# Patient Record
Sex: Male | Born: 1990 | Race: Black or African American | Hispanic: No | Marital: Single | Smoking: Current some day smoker
Health system: Southern US, Community
[De-identification: ages and names within clinical notes are randomized; demographics above are authoritative.]

## PROBLEM LIST (undated history)

## (undated) DIAGNOSIS — T300 Burn of unspecified body region, unspecified degree: Secondary | ICD-10-CM

## (undated) DIAGNOSIS — F410 Panic disorder [episodic paroxysmal anxiety] without agoraphobia: Secondary | ICD-10-CM

## (undated) DIAGNOSIS — F419 Anxiety disorder, unspecified: Secondary | ICD-10-CM

## (undated) HISTORY — PX: SKIN GRAFT: SHX250

---
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
## 2011-01-01 ENCOUNTER — Emergency Department (HOSPITAL_COMMUNITY)
Admission: EM | Admit: 2011-01-01 | Discharge: 2011-01-01 | Disposition: A | Discharge status: Home / Self Care | Payer: Self-pay | Attending: Emergency Medicine | Admitting: Emergency Medicine

## 2011-01-01 ENCOUNTER — Emergency Department (HOSPITAL_COMMUNITY): Payer: Self-pay

## 2011-01-01 ENCOUNTER — Encounter: Payer: Self-pay | Admitting: *Deleted

## 2011-01-01 ENCOUNTER — Other Ambulatory Visit: Payer: Self-pay

## 2011-01-01 DIAGNOSIS — R6883 Chills (without fever): Secondary | ICD-10-CM | POA: Insufficient documentation

## 2011-01-01 DIAGNOSIS — F17200 Nicotine dependence, unspecified, uncomplicated: Secondary | ICD-10-CM | POA: Insufficient documentation

## 2011-01-01 DIAGNOSIS — R5381 Other malaise: Secondary | ICD-10-CM | POA: Insufficient documentation

## 2011-01-01 DIAGNOSIS — R109 Unspecified abdominal pain: Secondary | ICD-10-CM | POA: Insufficient documentation

## 2011-01-01 DIAGNOSIS — M549 Dorsalgia, unspecified: Secondary | ICD-10-CM | POA: Insufficient documentation

## 2011-01-01 HISTORY — DX: Burn of unspecified body region, unspecified degree: T30.0

## 2011-01-01 HISTORY — DX: Anxiety disorder, unspecified: F41.9

## 2011-01-01 HISTORY — DX: Panic disorder (episodic paroxysmal anxiety): F41.0

## 2011-01-01 LAB — URINALYSIS, ROUTINE W REFLEX MICROSCOPIC
Bilirubin Urine: NEGATIVE
Hgb urine dipstick: NEGATIVE
Ketones, ur: NEGATIVE mg/dL
Leukocytes, UA: NEGATIVE
Protein, ur: NEGATIVE mg/dL
Urobilinogen, UA: 0.2 mg/dL (ref 0.0–1.0)
pH: 7 (ref 5.0–8.0)
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
## 2011-01-01 NOTE — ED Notes (Signed)
C/o lower back pain, worse on right side and abd pain.  Denies v/d.  C/o some nausea.  Denies injury to lower back or strenuous activity.  States abd pain started this afternoon after walking dogs.  Mother reports pt has not been "acting himself today."  Reports pt is normally active, states pt has been laying around all day today.
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
## 2011-03-02 LAB — RAPID URINE DRUG SCREEN, HOSP PERFORMED
Benzodiazepines: NOT DETECTED
Cocaine: NOT DETECTED
Opiates: NOT DETECTED
Tetrahydrocannabinol: POSITIVE — AB

## 2011-03-02 LAB — BASIC METABOLIC PANEL
BUN: 11
Glucose, Bld: 66 — ABNORMAL LOW
Sodium: 137

## 2011-03-02 LAB — CBC
MCHC: 33.2
MCV: 83.4
Platelets: 192
RDW: 12.6
WBC: 6.5

## 2011-03-02 LAB — URINALYSIS, ROUTINE W REFLEX MICROSCOPIC
Ketones, ur: 40 — AB
Nitrite: NEGATIVE
Specific Gravity, Urine: 1.015

## 2011-03-22 ENCOUNTER — Encounter (HOSPITAL_COMMUNITY): Payer: Self-pay | Admitting: *Deleted

## 2011-03-22 ENCOUNTER — Emergency Department (HOSPITAL_COMMUNITY)
Admission: EM | Admit: 2011-03-22 | Discharge: 2011-03-22 | Disposition: A | Discharge status: Home / Self Care | Payer: Self-pay | Attending: Emergency Medicine | Admitting: Emergency Medicine

## 2011-03-22 DIAGNOSIS — K0889 Other specified disorders of teeth and supporting structures: Secondary | ICD-10-CM

## 2011-03-22 DIAGNOSIS — F17200 Nicotine dependence, unspecified, uncomplicated: Secondary | ICD-10-CM | POA: Insufficient documentation

## 2011-03-22 DIAGNOSIS — K089 Disorder of teeth and supporting structures, unspecified: Secondary | ICD-10-CM | POA: Insufficient documentation

## 2011-03-22 DIAGNOSIS — W19XXXA Unspecified fall, initial encounter: Secondary | ICD-10-CM | POA: Insufficient documentation

## 2011-03-22 DIAGNOSIS — K029 Dental caries, unspecified: Secondary | ICD-10-CM | POA: Insufficient documentation

## 2011-03-22 DIAGNOSIS — K0381 Cracked tooth: Secondary | ICD-10-CM | POA: Insufficient documentation

## 2011-03-22 MED ORDER — HYDROCODONE-ACETAMINOPHEN 5-325 MG PO TABS: ORAL_TABLET | ORAL | Status: AC

## 2011-03-22 MED ORDER — PENICILLIN V POTASSIUM 500 MG PO TABS: 500.0000 mg | ORAL_TABLET | Freq: Four times a day (QID) | ORAL | Status: AC

## 2011-03-22 NOTE — ED Notes (Signed)
Discharge instructions reviewed with pt, questions answered. Pt verbalized understanding.  
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
## 2011-03-30 ENCOUNTER — Emergency Department (HOSPITAL_COMMUNITY): Payer: No Typology Code available for payment source

## 2011-03-30 ENCOUNTER — Emergency Department (HOSPITAL_COMMUNITY)
Admission: EM | Admit: 2011-03-30 | Discharge: 2011-03-30 | Disposition: A | Discharge status: Home / Self Care | Payer: No Typology Code available for payment source | Attending: Emergency Medicine | Admitting: Emergency Medicine

## 2011-03-30 ENCOUNTER — Encounter (HOSPITAL_COMMUNITY): Payer: Self-pay | Admitting: Emergency Medicine

## 2011-03-30 DIAGNOSIS — S20219A Contusion of unspecified front wall of thorax, initial encounter: Secondary | ICD-10-CM | POA: Insufficient documentation

## 2011-03-30 DIAGNOSIS — R079 Chest pain, unspecified: Secondary | ICD-10-CM | POA: Insufficient documentation

## 2011-03-30 DIAGNOSIS — M79609 Pain in unspecified limb: Secondary | ICD-10-CM | POA: Insufficient documentation

## 2011-03-30 DIAGNOSIS — T07XXXA Unspecified multiple injuries, initial encounter: Secondary | ICD-10-CM

## 2011-03-30 MED ORDER — NAPROXEN 500 MG PO TABS: 500.0000 mg | ORAL_TABLET | Freq: Two times a day (BID) | ORAL | Status: AC

## 2011-03-30 MED ORDER — CYCLOBENZAPRINE HCL 10 MG PO TABS: 10.0000 mg | ORAL_TABLET | Freq: Three times a day (TID) | ORAL | Status: AC | PRN
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
## 2011-03-30 NOTE — ED Provider Notes (Signed)
Medical screening examination/treatment/procedure(s) were performed by non-physician practitioner and as supervising physician I was immediately available for consultation/collaboration.  Donnetta Hutching, MD 03/30/11 [phone]
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
## 2011-04-02 NOTE — ED Provider Notes (Signed)
Medical screening examination/treatment/procedure(s) were performed by non-physician practitioner and as supervising physician I was immediately available for consultation/collaboration. Anwita Mencer, MD, FACEP   Kambria Grima L Keandrea Tapley, MD 04/02/11 0219 

## 2011-10-30 ENCOUNTER — Encounter (HOSPITAL_COMMUNITY): Payer: Self-pay | Admitting: Emergency Medicine

## 2011-10-30 ENCOUNTER — Emergency Department (HOSPITAL_COMMUNITY)
Admission: EM | Admit: 2011-10-30 | Discharge: 2011-10-30 | Disposition: A | Discharge status: Home / Self Care | Payer: Self-pay | Attending: Emergency Medicine | Admitting: Emergency Medicine

## 2011-10-30 DIAGNOSIS — K029 Dental caries, unspecified: Secondary | ICD-10-CM | POA: Insufficient documentation

## 2011-10-30 DIAGNOSIS — K0889 Other specified disorders of teeth and supporting structures: Secondary | ICD-10-CM

## 2011-10-30 DIAGNOSIS — F17200 Nicotine dependence, unspecified, uncomplicated: Secondary | ICD-10-CM | POA: Insufficient documentation

## 2011-10-30 MED ORDER — CLINDAMYCIN HCL 300 MG PO CAPS: ORAL_CAPSULE | ORAL | Status: DC

## 2011-10-30 MED ORDER — HYDROCODONE-ACETAMINOPHEN 5-325 MG PO TABS: ORAL_TABLET | ORAL | Status: AC

## 2011-10-30 NOTE — ED Notes (Signed)
Pt presents with c/o lower left tooth pain and abscess. Left lower jaw and facial swelling noted. NAD noted. Pt denies fever.
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
## 2011-10-30 NOTE — ED Notes (Signed)
Pt c/o left sided jaw pain since yesterday.

## 2011-10-30 NOTE — Discharge Instructions (Signed)
Dental Pain A tooth ache may be caused by cavities (tooth decay). Cavities expose the nerve of the tooth to air and hot or cold temperatures. It may come from an infection or abscess (also called a boil or furuncle) around your tooth. It is also often caused by dental caries (tooth decay). This causes the pain you are having. DIAGNOSIS  Your caregiver can diagnose this problem by exam. TREATMENT   If caused by an infection, it may be treated with medications which kill germs (antibiotics) and pain medications as prescribed by your caregiver. Take medications as directed.   Only take over-the-counter or prescription medicines for pain, discomfort, or fever as directed by your caregiver.   Whether the tooth ache today is caused by infection or dental disease, you should see your dentist as soon as possible for further care.  SEEK MEDICAL CARE IF: The exam and treatment you received today has been provided on an emergency basis only. This is not a substitute for complete medical or dental care. If your problem worsens or new problems (symptoms) appear, and you are unable to meet with your dentist, call or return to this location. SEEK IMMEDIATE MEDICAL CARE IF:   You have a fever.   You develop redness and swelling of your face, jaw, or neck.   You are unable to open your mouth.   You have severe pain uncontrolled by pain medicine.  MAKE SURE YOU:   Understand these instructions.   Will watch your condition.   Will get help right away if you are not doing well or get worse.  Document Released: 05/17/2005 Document Revised: 05/06/2011 Document Reviewed: 01/03/2008 ExitCare Patient Information 2012 [employer].  RESOURCE GUIDE  Chronic Pain Problems: Contact [facility]  [phone] Patients need to be referred by their primary care doctor.  Insufficient Money for Medicine: Contact United Way:  call "211" or [facility] [phone].  No Primary Care  Doctor: - Call [facility]  [phone] - can help you locate a primary care doctor that  accepts your insurance, provides certain services, etc. - Physician Referral Service- 1-800-533-3463  Agencies that provide inexpensive medical care: - [facility]  [phone] - [facility]  [phone] - [facility]  [phone] - [facility]  [phone] - [facility]  [phone] - [facility]  [phone]  Medicaid-accepting [place] Providers: - [facility]- [address]  [phone], Mon-Fri 9am-7pm, Sat 9am-1pm - [facility]- [address], [address]  [phone] - [facility]- [address], [address]  [phone] - [facility]- [address] [place] [address]  [phone] - Veita B[facility]- [address], [address], [phone]  Only accepts [place] Access Medicaid patients after they have their name  applied to their card  Self Pay (no insurance) in [place]: - Sickle Cell Patients: Dr Eric Dean, [facility]  [address], [phone] - [facility]- [address]  [phone]       -     [facility]- [address]       -     [facility]- see information above (Speak to Pam H if you do not have insurance)       -  [facility]- [address], [phone]       -    [facility] [place]- [address],  [phone]       -  [facility]- [address] [place] [address], [phone]       -  Dr Osei-Bonsu-  [address], [place], [phone]       -  [facility]- [address], [phone]       -  [facility]- [address] [place] [address], [phone], also [address], [phone]       -    [facility]- [address], [phone], 1st & 3rd Saturday   every month, 10am-1pm  1) Find a Doctor and Pay Out of  Pocket Although you won't have to find out who is covered by your insurance plan, it is a good idea to ask around and get recommendations. You will then need to call the office and see if the doctor you have chosen will accept you as a new patient and what types of options they offer for patients who are self-pay. Some doctors offer discounts or will set up payment plans for their patients who do not have insurance, but you will need to ask so you aren't surprised when you get to your appointment.  2) Contact Your Local Health Department Not all health departments have doctors that can see patients for sick visits, but many do, so it is worth a call to see if yours does. If you don't know where your local health department is, you can check in your phone book. The CDC also has a tool to help you locate your state's health department, and many state websites also have listings of all of their local health departments.  3) Find a Walk-in Clinic If your illness is not likely to be very severe or complicated, you may want to try a walk in clinic. These are popping up all over the country in pharmacies, drugstores, and shopping centers. They're usually staffed by nurse practitioners or physician assistants that have been trained to treat common illnesses and complaints. They're usually fairly quick and inexpensive. However, if you have serious medical issues or chronic medical problems, these are probably not your best option  STD Testing - [place] [facility] [place], [address], [place], phone [phone] or [phone].  Monday - Friday, call for an appointment. - [place] [facility] [place], [facility], [address], [place], phone [phone] or 1-877-539-9860.  Monday - Friday, call for an appointment.  Abuse/Neglect: - [place] Child Abuse Hotline (336) 641-3795 - [place] Child Abuse Hotline 800-378-5315  (After Hours)  Emergency Shelter:  [facility] (336) 271-5985  Maternity Homes: - [facility] (336) 275-9566 - [facility] (704) 372-4663  MRSA Hotline #:   832-7006  [place] Resources  [facility] [place]  [employer] [place] [facility] [address]                 [address]         [address]  [place]                                               [place]                                [place] Phone:  [phone]                                  Phone:  [phone]                   Phone:  [phone]  [place] [facility], [phone] - [place] Services - [facility]- 1-888-581-9988       -     [facility] in [place], [address],                                  336-349-4454, Insurance  [place] Child Abuse Hotline (336) 342-1394 or (336) 342-3537 (After Hours)   Behavioral Health Services  Substance Abuse Resources: - [facility]  336-882-2125 - [facility] 336-784-9470 - [facility] 336-285-9073 - [facility] 336-845-3988 - Residential & Outpatient Substance Abuse Program  800-659-3381  Psychological Services: - [facility]  [phone] - [facility]  [phone] - [place] [facility], [address], [place], ACCESS LINE: 1-800-853-5163 or 336-641-4981, Http://www.[facility].com/services/adult.htm  Dental Assistance  If unable to pay or uninsured, contact:  [facility] or [place] [facility] to become qualified for the adult dental clinic.  Patients with Medicaid: [facility] [address], [phone], [phone]  If unable to pay, or uninsured, contact [facility] ([phone]) or [place] [facility] ([phone] in [place], [phone] in [place]) to  become qualified for the adult dental clinic  Other Low-Cost Community Dental Services: - [facility]- [address], [place], [place], [address], [phone], [address], 2nd and 4th Thursday of the month at 6:30am.  10 clients each day by appointment, can sometimes see walk-in patients if someone does not show for an appointment. - [facility]- [address], [place], [place], [address], [phone] - [facility]- [address], [place], , [address], [phone] - [place] [facility]- [phone] - [facility]- [phone] - [facility]- [phone]     

## 2011-11-02 NOTE — ED Provider Notes (Signed)
Medical screening examination/treatment/procedure(s) were performed by non-physician practitioner and as supervising physician I was immediately available for consultation/collaboration.  Shelda Jakes, MD 11/02/11 [phone]

## 2012-03-20 ENCOUNTER — Emergency Department (HOSPITAL_COMMUNITY)
Admission: EM | Admit: 2012-03-20 | Discharge: 2012-03-20 | Disposition: A | Discharge status: Home / Self Care | Payer: Self-pay | Attending: Emergency Medicine | Admitting: Emergency Medicine

## 2012-03-20 ENCOUNTER — Encounter (HOSPITAL_COMMUNITY): Payer: Self-pay

## 2012-03-20 ENCOUNTER — Emergency Department (HOSPITAL_COMMUNITY): Payer: Self-pay

## 2012-03-20 DIAGNOSIS — Y9361 Activity, american tackle football: Secondary | ICD-10-CM | POA: Insufficient documentation

## 2012-03-20 DIAGNOSIS — S93601A Unspecified sprain of right foot, initial encounter: Secondary | ICD-10-CM

## 2012-03-20 DIAGNOSIS — X500XXA Overexertion from strenuous movement or load, initial encounter: Secondary | ICD-10-CM | POA: Insufficient documentation

## 2012-03-20 DIAGNOSIS — Z87828 Personal history of other (healed) physical injury and trauma: Secondary | ICD-10-CM | POA: Insufficient documentation

## 2012-03-20 DIAGNOSIS — S93609A Unspecified sprain of unspecified foot, initial encounter: Secondary | ICD-10-CM | POA: Insufficient documentation

## 2012-03-20 DIAGNOSIS — F17200 Nicotine dependence, unspecified, uncomplicated: Secondary | ICD-10-CM | POA: Insufficient documentation

## 2012-03-20 DIAGNOSIS — Z8659 Personal history of other mental and behavioral disorders: Secondary | ICD-10-CM | POA: Insufficient documentation

## 2012-03-20 DIAGNOSIS — Y929 Unspecified place or not applicable: Secondary | ICD-10-CM | POA: Insufficient documentation

## 2012-03-20 MED ORDER — HYDROCODONE-ACETAMINOPHEN 5-325 MG PO TABS: 1.0000 | ORAL_TABLET | Freq: Four times a day (QID) | ORAL | Status: AC | PRN

## 2012-03-20 MED ORDER — IBUPROFEN 800 MG PO TABS
800.0000 mg | ORAL_TABLET | Freq: Once | ORAL | Status: AC
  Administered 2012-03-20: 800 mg via ORAL
  Filled 2012-03-20: qty 1

## 2012-03-20 NOTE — ED Notes (Signed)
Pt was playing football barefoot today, states he bent his foot from the toes back and is having pain

## 2012-03-20 NOTE — ED Provider Notes (Signed)
Medical screening examination/treatment/procedure(s) were performed by non-physician practitioner and as supervising physician I was immediately available for consultation/collaboration.  Mako Pelfrey S. Joelene Barriere, MD 03/20/12 0529 
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
## 2012-04-03 NOTE — ED Provider Notes (Signed)
Medical screening examination/treatment/procedure(s) were performed by non-physician practitioner and as supervising physician I was immediately available for consultation/collaboration.  Kaiulani Sitton S. Lonzell Dorris, MD 04/03/12 2332 

## 2013-08-15 ENCOUNTER — Encounter (HOSPITAL_COMMUNITY): Payer: Self-pay | Admitting: Emergency Medicine

## 2013-08-15 ENCOUNTER — Emergency Department (HOSPITAL_COMMUNITY)
Admission: EM | Admit: 2013-08-15 | Discharge: 2013-08-15 | Disposition: A | Discharge status: Home / Self Care | Payer: Self-pay | Attending: Emergency Medicine | Admitting: Emergency Medicine

## 2013-08-15 DIAGNOSIS — F411 Generalized anxiety disorder: Secondary | ICD-10-CM | POA: Insufficient documentation

## 2013-08-15 DIAGNOSIS — K089 Disorder of teeth and supporting structures, unspecified: Secondary | ICD-10-CM | POA: Insufficient documentation

## 2013-08-15 DIAGNOSIS — F17200 Nicotine dependence, unspecified, uncomplicated: Secondary | ICD-10-CM | POA: Insufficient documentation

## 2013-08-15 DIAGNOSIS — K029 Dental caries, unspecified: Secondary | ICD-10-CM | POA: Insufficient documentation

## 2013-08-15 DIAGNOSIS — K0889 Other specified disorders of teeth and supporting structures: Secondary | ICD-10-CM

## 2013-08-15 MED ORDER — TRAMADOL HCL 50 MG PO TABS: 50.0000 mg | ORAL_TABLET | Freq: Four times a day (QID) | ORAL | Status: DC | PRN

## 2013-08-15 MED ORDER — AMOXICILLIN 250 MG PO CAPS
500.0000 mg | ORAL_CAPSULE | Freq: Once | ORAL | Status: AC
  Administered 2013-08-15: 500 mg via ORAL
  Filled 2013-08-15: qty 2

## 2013-08-15 MED ORDER — HYDROCODONE-ACETAMINOPHEN 5-325 MG PO TABS
1.0000 | ORAL_TABLET | Freq: Once | ORAL | Status: AC
  Administered 2013-08-15: 1 via ORAL
  Filled 2013-08-15: qty 1

## 2013-08-15 MED ORDER — AMOXICILLIN 500 MG PO CAPS: 500.0000 mg | ORAL_CAPSULE | Freq: Three times a day (TID) | ORAL | Status: DC

## 2013-08-15 NOTE — Discharge Instructions (Signed)
Dental Pain °Toothache is pain in or around a tooth. It may get worse with chewing or with cold or heat.  °HOME CARE °· Your dentist may use a numbing medicine during treatment. If so, you may need to avoid eating until the medicine wears off. Ask your dentist about this. °· Only take medicine as told by your dentist or doctor. °· Avoid chewing food near the painful tooth until after all treatment is done. Ask your dentist about this. °GET HELP RIGHT AWAY IF:  °· The problem gets worse or new problems appear. °· You have a fever. °· There is redness and puffiness (swelling) of the face, jaw, or neck. °· You cannot open your mouth. °· There is pain in the jaw. °· There is very bad pain that is not helped by medicine. °MAKE SURE YOU:  °· Understand these instructions. °· Will watch your condition. °· Will get help right away if you are not doing well or get worse. °Document Released: 11/03/2007 Document Revised: 08/09/2011 Document Reviewed: 11/03/2007 °ExitCare® Patient Information ©2014 ExitCare[employer]. ° °

## 2013-08-15 NOTE — ED Notes (Signed)
Pt c/o dental pain since yesterday.  
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
## 2013-08-18 NOTE — ED Provider Notes (Signed)
Medical screening examination/treatment/procedure(s) were performed by non-physician practitioner and as supervising physician I was immediately available for consultation/collaboration.   EKG Interpretation None       Blayke Pinera, MD 08/18/13 0740 

## 2014-02-16 ENCOUNTER — Encounter (HOSPITAL_COMMUNITY): Payer: Self-pay | Admitting: Emergency Medicine

## 2014-02-16 ENCOUNTER — Emergency Department (HOSPITAL_COMMUNITY)
Admission: EM | Admit: 2014-02-16 | Discharge: 2014-02-16 | Disposition: A | Discharge status: Home / Self Care | Payer: Self-pay | Attending: Emergency Medicine | Admitting: Emergency Medicine

## 2014-02-16 DIAGNOSIS — H9209 Otalgia, unspecified ear: Secondary | ICD-10-CM | POA: Insufficient documentation

## 2014-02-16 DIAGNOSIS — F17200 Nicotine dependence, unspecified, uncomplicated: Secondary | ICD-10-CM | POA: Insufficient documentation

## 2014-02-16 DIAGNOSIS — K0381 Cracked tooth: Secondary | ICD-10-CM | POA: Insufficient documentation

## 2014-02-16 DIAGNOSIS — F411 Generalized anxiety disorder: Secondary | ICD-10-CM | POA: Insufficient documentation

## 2014-02-16 DIAGNOSIS — H6502 Acute serous otitis media, left ear: Secondary | ICD-10-CM

## 2014-02-16 DIAGNOSIS — H6500 Acute serous otitis media, unspecified ear: Secondary | ICD-10-CM | POA: Insufficient documentation

## 2014-02-16 DIAGNOSIS — K029 Dental caries, unspecified: Secondary | ICD-10-CM | POA: Insufficient documentation

## 2014-02-16 DIAGNOSIS — Z87828 Personal history of other (healed) physical injury and trauma: Secondary | ICD-10-CM | POA: Insufficient documentation

## 2014-02-16 MED ORDER — NEOMYCIN-POLYMYXIN-HC 3.5-10000-1 OT SUSP
3.0000 [drp] | Freq: Four times a day (QID) | OTIC | Status: DC
  Administered 2014-02-16: 3 [drp] via OTIC
  Filled 2014-02-16: qty 10

## 2014-02-16 MED ORDER — NEOMYCIN-COLIST-HC-THONZONIUM 3.3-3-10-0.5 MG/ML OT SUSP: 3.0000 [drp] | Freq: Four times a day (QID) | OTIC | Status: DC

## 2014-02-16 MED ORDER — TRAMADOL HCL 50 MG PO TABS: 50.0000 mg | ORAL_TABLET | Freq: Four times a day (QID) | ORAL | Status: DC | PRN

## 2014-02-16 MED ORDER — NAPROXEN 500 MG PO TABS: 500.0000 mg | ORAL_TABLET | Freq: Two times a day (BID) | ORAL | Status: DC

## 2014-02-16 MED ORDER — AMOXICILLIN 500 MG PO CAPS: 500.0000 mg | ORAL_CAPSULE | Freq: Three times a day (TID) | ORAL | Status: DC
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
## 2014-02-16 NOTE — Discharge Instructions (Signed)
YOU WILL NEED TO FOLLOW UP WITH DR. Gerda Diss NEXT WEEK TO BE SURE THE EAR IS IMPROVING.   YOU WILL NEED TO FOLLOW UP WITH A DENTIST AS SOON AS POSSIBLE FOR THE BROKEN, DECAYED TOOTH OR IT IS GOING TO START TO GIVE YOU PROBLEMS.

## 2014-02-16 NOTE — ED Notes (Signed)
1 week hx of L ear pain.  States he also has broken bottom molar on L side.  Pain radiating from mid neck up past temporal area.  Denies ear drainage, fevers, chills, n/v.

## 2014-02-18 NOTE — ED Provider Notes (Signed)
Medical screening examination/treatment/procedure(s) were performed by non-physician practitioner and as supervising physician I was immediately available for consultation/collaboration.   EKG Interpretation None        Benny Lennert, MD 02/18/14 [phone]

## 2014-10-25 ENCOUNTER — Emergency Department (HOSPITAL_COMMUNITY)
Admission: EM | Admit: 2014-10-25 | Discharge: 2014-10-25 | Disposition: A | Discharge status: Home / Self Care | Payer: Medicaid Other | Attending: Emergency Medicine | Admitting: Emergency Medicine

## 2014-10-25 ENCOUNTER — Encounter (HOSPITAL_COMMUNITY): Payer: Self-pay

## 2014-10-25 DIAGNOSIS — M5489 Other dorsalgia: Secondary | ICD-10-CM | POA: Diagnosis not present

## 2014-10-25 DIAGNOSIS — M79671 Pain in right foot: Secondary | ICD-10-CM | POA: Diagnosis not present

## 2014-10-25 DIAGNOSIS — Z720 Tobacco use: Secondary | ICD-10-CM | POA: Insufficient documentation

## 2014-10-25 DIAGNOSIS — Z792 Long term (current) use of antibiotics: Secondary | ICD-10-CM | POA: Diagnosis not present

## 2014-10-25 DIAGNOSIS — F410 Panic disorder [episodic paroxysmal anxiety] without agoraphobia: Secondary | ICD-10-CM | POA: Diagnosis not present

## 2014-10-25 DIAGNOSIS — Z791 Long term (current) use of non-steroidal anti-inflammatories (NSAID): Secondary | ICD-10-CM | POA: Insufficient documentation

## 2014-10-25 DIAGNOSIS — Z87828 Personal history of other (healed) physical injury and trauma: Secondary | ICD-10-CM | POA: Diagnosis not present

## 2014-10-25 DIAGNOSIS — M549 Dorsalgia, unspecified: Secondary | ICD-10-CM | POA: Diagnosis present

## 2014-10-25 MED ORDER — KETOROLAC TROMETHAMINE 10 MG PO TABS
10.0000 mg | ORAL_TABLET | Freq: Once | ORAL | Status: AC
  Administered 2014-10-25: 10 mg via ORAL
  Filled 2014-10-25: qty 1

## 2014-10-25 MED ORDER — DEXAMETHASONE 4 MG PO TABS: 4.0000 mg | ORAL_TABLET | Freq: Two times a day (BID) | ORAL | Status: DC

## 2014-10-25 MED ORDER — ACETAMINOPHEN 325 MG PO TABS
650.0000 mg | ORAL_TABLET | Freq: Once | ORAL | Status: AC
  Administered 2014-10-25: 650 mg via ORAL
  Filled 2014-10-25: qty 2

## 2014-10-25 MED ORDER — METHOCARBAMOL 500 MG PO TABS
500.0000 mg | ORAL_TABLET | Freq: Three times a day (TID) | ORAL | Status: DC
Start: 2014-10-25 — End: 2020-06-03

## 2014-10-25 MED ORDER — METHOCARBAMOL 500 MG PO TABS
1000.0000 mg | ORAL_TABLET | Freq: Once | ORAL | Status: AC
  Administered 2014-10-25: 1000 mg via ORAL
  Filled 2014-10-25: qty 2

## 2014-10-25 MED ORDER — IBUPROFEN 800 MG PO TABS: 800.0000 mg | ORAL_TABLET | Freq: Three times a day (TID) | ORAL | Status: DC

## 2014-10-25 NOTE — ED Notes (Signed)
Pt made aware to return if symptoms worsen or if any life threatening symptoms occur.   
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
## 2014-10-25 NOTE — ED Notes (Signed)
Complain of mid back pain that started two days ago. Also, toe pain on right foot for a month

## 2017-09-24 ENCOUNTER — Emergency Department (HOSPITAL_COMMUNITY): Payer: Self-pay

## 2017-09-24 ENCOUNTER — Encounter (HOSPITAL_COMMUNITY): Payer: Self-pay | Admitting: Emergency Medicine

## 2017-09-24 ENCOUNTER — Emergency Department (HOSPITAL_COMMUNITY)
Admission: EM | Admit: 2017-09-24 | Discharge: 2017-09-24 | Disposition: A | Discharge status: Home / Self Care | Payer: Self-pay | Attending: Emergency Medicine | Admitting: Emergency Medicine

## 2017-09-24 ENCOUNTER — Other Ambulatory Visit: Payer: Self-pay

## 2017-09-24 DIAGNOSIS — W2209XA Striking against other stationary object, initial encounter: Secondary | ICD-10-CM | POA: Insufficient documentation

## 2017-09-24 DIAGNOSIS — Y999 Unspecified external cause status: Secondary | ICD-10-CM | POA: Insufficient documentation

## 2017-09-24 DIAGNOSIS — F17210 Nicotine dependence, cigarettes, uncomplicated: Secondary | ICD-10-CM | POA: Insufficient documentation

## 2017-09-24 DIAGNOSIS — Z79899 Other long term (current) drug therapy: Secondary | ICD-10-CM | POA: Insufficient documentation

## 2017-09-24 DIAGNOSIS — Y939 Activity, unspecified: Secondary | ICD-10-CM | POA: Insufficient documentation

## 2017-09-24 DIAGNOSIS — Y92009 Unspecified place in unspecified non-institutional (private) residence as the place of occurrence of the external cause: Secondary | ICD-10-CM | POA: Insufficient documentation

## 2017-09-24 DIAGNOSIS — S60221A Contusion of right hand, initial encounter: Secondary | ICD-10-CM | POA: Insufficient documentation

## 2017-09-24 MED ORDER — TETANUS-DIPHTH-ACELL PERTUSSIS 5-2.5-18.5 LF-MCG/0.5 IM SUSP
0.5000 mL | Freq: Once | INTRAMUSCULAR | Status: AC
  Administered 2017-09-24: 0.5 mL via INTRAMUSCULAR
  Filled 2017-09-24: qty 0.5
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
## 2017-09-24 NOTE — Discharge Instructions (Signed)
See the Orthopaedist if pain persist past one week.  Ibuprofen for pain

## 2018-06-21 ENCOUNTER — Emergency Department (HOSPITAL_COMMUNITY): Payer: Self-pay

## 2018-06-21 ENCOUNTER — Other Ambulatory Visit: Payer: Self-pay

## 2018-06-21 ENCOUNTER — Encounter (HOSPITAL_COMMUNITY): Payer: Self-pay | Admitting: *Deleted

## 2018-06-21 ENCOUNTER — Emergency Department (HOSPITAL_COMMUNITY)
Admission: EM | Admit: 2018-06-21 | Discharge: 2018-06-21 | Disposition: A | Discharge status: Home / Self Care | Payer: Self-pay | Attending: Emergency Medicine | Admitting: Emergency Medicine

## 2018-06-21 DIAGNOSIS — F17210 Nicotine dependence, cigarettes, uncomplicated: Secondary | ICD-10-CM | POA: Insufficient documentation

## 2018-06-21 DIAGNOSIS — Z79899 Other long term (current) drug therapy: Secondary | ICD-10-CM | POA: Insufficient documentation

## 2018-06-21 DIAGNOSIS — J189 Pneumonia, unspecified organism: Secondary | ICD-10-CM

## 2018-06-21 DIAGNOSIS — J181 Lobar pneumonia, unspecified organism: Secondary | ICD-10-CM | POA: Insufficient documentation

## 2018-06-21 LAB — INFLUENZA PANEL BY PCR (TYPE A & B)
Influenza A By PCR: NEGATIVE
Influenza B By PCR: NEGATIVE

## 2018-06-21 MED ORDER — ACETAMINOPHEN 325 MG PO TABS
ORAL_TABLET | ORAL | Status: AC
  Filled 2018-06-21: qty 2

## 2018-06-21 MED ORDER — AZITHROMYCIN 250 MG PO TABS: ORAL_TABLET | ORAL | 0 refills | Status: DC

## 2018-06-21 MED ORDER — AMOXICILLIN 500 MG PO CAPS: 1000.0000 mg | ORAL_CAPSULE | Freq: Three times a day (TID) | ORAL | 0 refills | Status: DC

## 2018-06-21 MED ORDER — IBUPROFEN 800 MG PO TABS: 800.0000 mg | ORAL_TABLET | Freq: Three times a day (TID) | ORAL | 0 refills | Status: DC

## 2018-06-21 MED ORDER — AZITHROMYCIN 250 MG PO TABS
500.0000 mg | ORAL_TABLET | Freq: Once | ORAL | Status: AC
  Administered 2018-06-21: 500 mg via ORAL
  Filled 2018-06-21: qty 2

## 2018-06-21 MED ORDER — ACETAMINOPHEN 325 MG PO TABS
650.0000 mg | ORAL_TABLET | Freq: Once | ORAL | Status: AC | PRN
  Administered 2018-06-21: 650 mg via ORAL

## 2018-06-21 MED ORDER — AMOXICILLIN 250 MG PO CAPS
1000.0000 mg | ORAL_CAPSULE | Freq: Once | ORAL | Status: AC
  Administered 2018-06-21: 1000 mg via ORAL
  Filled 2018-06-21: qty 4

## 2018-06-21 NOTE — Discharge Instructions (Addendum)
It is important to take the antibiotics as directed until they are finished.  Drink plenty of fluids.  Take over-the-counter Tylenol every 4 hours for fever.  Alternate this with the prescription ibuprofen.  Follow-up with your primary doctor or return here for recheck, or return here for any worsening symptoms.
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
## 2018-06-21 NOTE — ED Triage Notes (Signed)
Pt c/o cough along with back and chest pain with coughing x 2 days. Pt also c/o headache and vomiting with coughing.

## 2019-09-11 ENCOUNTER — Encounter (HOSPITAL_COMMUNITY): Payer: Self-pay | Admitting: Emergency Medicine

## 2019-09-11 ENCOUNTER — Emergency Department (HOSPITAL_COMMUNITY): Payer: Self-pay

## 2019-09-11 ENCOUNTER — Emergency Department (HOSPITAL_COMMUNITY)
Admission: EM | Admit: 2019-09-11 | Discharge: 2019-09-11 | Disposition: A | Discharge status: Home / Self Care | Payer: Self-pay | Attending: Emergency Medicine | Admitting: Emergency Medicine

## 2019-09-11 ENCOUNTER — Other Ambulatory Visit: Payer: Self-pay

## 2019-09-11 DIAGNOSIS — M79605 Pain in left leg: Secondary | ICD-10-CM | POA: Insufficient documentation

## 2019-09-11 DIAGNOSIS — M79604 Pain in right leg: Secondary | ICD-10-CM | POA: Insufficient documentation

## 2019-09-11 DIAGNOSIS — M25512 Pain in left shoulder: Secondary | ICD-10-CM | POA: Insufficient documentation

## 2019-09-11 DIAGNOSIS — Z79899 Other long term (current) drug therapy: Secondary | ICD-10-CM | POA: Insufficient documentation

## 2019-09-11 DIAGNOSIS — M791 Myalgia, unspecified site: Secondary | ICD-10-CM

## 2019-09-11 DIAGNOSIS — M25511 Pain in right shoulder: Secondary | ICD-10-CM | POA: Insufficient documentation

## 2019-09-11 DIAGNOSIS — F17210 Nicotine dependence, cigarettes, uncomplicated: Secondary | ICD-10-CM | POA: Insufficient documentation

## 2019-09-11 DIAGNOSIS — R079 Chest pain, unspecified: Secondary | ICD-10-CM | POA: Insufficient documentation

## 2019-09-11 DIAGNOSIS — M549 Dorsalgia, unspecified: Secondary | ICD-10-CM | POA: Insufficient documentation

## 2019-09-11 LAB — CBG MONITORING, ED: Glucose-Capillary: 89 mg/dL (ref 70–99)

## 2019-09-11 MED ORDER — HYDROCODONE-ACETAMINOPHEN 5-325 MG PO TABS: 1.0000 | ORAL_TABLET | ORAL | 0 refills | Status: DC | PRN

## 2019-09-11 MED ORDER — DEXAMETHASONE SODIUM PHOSPHATE 10 MG/ML IJ SOLN
10.0000 mg | Freq: Once | INTRAMUSCULAR | Status: AC
  Administered 2019-09-11: 10 mg via INTRAMUSCULAR
  Filled 2019-09-11: qty 1

## 2019-09-11 MED ORDER — KETOROLAC TROMETHAMINE 30 MG/ML IJ SOLN
30.0000 mg | Freq: Once | INTRAMUSCULAR | Status: AC
  Administered 2019-09-11: 30 mg via INTRAMUSCULAR
  Filled 2019-09-11: qty 1

## 2019-09-11 MED ORDER — PREDNISONE 10 MG (21) PO TBPK: ORAL_TABLET | ORAL | 0 refills | Status: DC

## 2019-09-11 NOTE — ED Triage Notes (Signed)
C/o upper shoulder and back pain rating pain 8/10 and 10/10 at night.  Denies injuries.  Also c/o pain to both feet rating 10/10 for last 10 days.
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
## 2020-06-03 ENCOUNTER — Encounter: Payer: Self-pay | Admitting: Internal Medicine

## 2020-06-03 ENCOUNTER — Other Ambulatory Visit: Payer: Self-pay

## 2020-06-03 ENCOUNTER — Ambulatory Visit (INDEPENDENT_AMBULATORY_CARE_PROVIDER_SITE_OTHER): Payer: Self-pay | Admitting: Internal Medicine

## 2020-06-03 VITALS — BP 125/80 | HR 85 | Temp 99.2°F | Resp 18 | Ht 63.0 in | Wt 135.4 lb

## 2020-06-03 DIAGNOSIS — M79641 Pain in right hand: Secondary | ICD-10-CM

## 2020-06-03 DIAGNOSIS — G8929 Other chronic pain: Secondary | ICD-10-CM

## 2020-06-03 DIAGNOSIS — M545 Low back pain, unspecified: Secondary | ICD-10-CM

## 2020-06-03 DIAGNOSIS — M21611 Bunion of right foot: Secondary | ICD-10-CM | POA: Insufficient documentation

## 2020-06-03 DIAGNOSIS — Z7689 Persons encountering health services in other specified circumstances: Secondary | ICD-10-CM | POA: Insufficient documentation

## 2020-06-03 MED ORDER — TRAMADOL HCL 50 MG PO TABS: 50.0000 mg | ORAL_TABLET | Freq: Two times a day (BID) | ORAL | 0 refills | Status: AC | PRN

## 2020-06-03 NOTE — Assessment & Plan Note (Signed)
Has had pain in right hand intermittently Occasional stiffness Check right hand X-ray Advised to take Tylenol/Ibuprofen PRN

## 2020-06-03 NOTE — Assessment & Plan Note (Signed)
Referred to Podiatry Advised to use wide bases shoes Heating pad or pain relieving creams Tramadol PRN for severe pain

## 2020-06-03 NOTE — Assessment & Plan Note (Signed)
Care established Previous chart reviewed History and medications reviewed with the patient 

## 2020-06-03 NOTE — Patient Instructions (Addendum)
Bunion  A bunion is a bump on the base of the big toe that forms when the bones of the big toe joint move out of position. Bunions may be small at first, but they often get larger over time. They can make walking painful. How is this treated? Treatment depends on the severity of your symptoms. The goal of treatment is to relieve symptoms and prevent the bunion from getting worse. Your health care provider may recommend:  Wearing shoes that have a wide toe box.  Using bunion pads to cushion the affected area.  Taping your toes together to keep them in a normal position.  Placing a device inside your shoe (orthotics) to help reduce pressure on your toe joint.  Taking medicine to ease pain, inflammation, and swelling.  Applying heat or ice to the affected area.  Doing stretching exercises.  Surgery to remove scar tissue and move the toes back into their normal position. This treatment is rare. Follow these instructions at home: Managing pain, stiffness, and swelling   If directed, put ice on the painful area: ? Put ice in a plastic bag. ? Place a towel between your skin and the bag. ? Leave the ice on for 20 minutes, 2-3 times a day. Activity   If directed, apply heat to the affected area before you exercise. Use the heat source that your health care provider recommends, such as a moist heat pack or a heating pad. ? Place a towel between your skin and the heat source. ? Leave the heat on for 20-30 minutes. ? Remove the heat if your skin turns bright red. This is especially important if you are unable to feel pain, heat, or cold. You may have a greater risk of getting burned.  Do exercises as told by your health care provider. General instructions  Support your toe joint with proper footwear, shoe padding, or taping as told by your health care provider.  Take over-the-counter and prescription medicines only as told by your health care provider.  Keep all follow-up visits as  told by your health care provider. This is important. Contact a health care provider if your symptoms:  Get worse.  Do not improve in 2 weeks. Get help right away if you have:  Severe pain and trouble with walking. Summary  A bunion is a bump on the base of the big toe that forms when the bones of the big toe joint move out of position.  Bunions can make walking painful.  Treatment depends on the severity of your symptoms.  Support your toe joint with proper footwear, shoe padding, or taping as told by your health care provider. This information is not intended to replace advice given to you by your health care provider. Make sure you discuss any questions you have with your health care provider. Document Revised: 11/21/2017 Document Reviewed: 09/27/2017 Elsevier Patient Education  [place].  Back Exercises These exercises help to make your trunk and back strong. They also help to keep the lower back flexible. Doing these exercises can help to prevent back pain or lessen existing pain.  If you have back pain, try to do these exercises 2-3 times each day or as told by your doctor.  As you get better, do the exercises once each day. Repeat the exercises more often as told by your doctor.  To stop back pain from coming back, do the exercises once each day, or as told by your doctor. Exercises Single knee to chest  Do these steps 3-5 times in a row for each leg: 1. Lie on your back on a firm bed or the floor with your legs stretched out. 2. Bring one knee to your chest. 3. Grab your knee or thigh with both hands and hold them it in place. 4. Pull on your knee until you feel a gentle stretch in your lower back or buttocks. 5. Keep doing the stretch for 10-30 seconds. 6. Slowly let go of your leg and straighten it. Pelvic tilt Do these steps 5-10 times in a row: 1. Lie on your back on a firm bed or the floor with your legs stretched out. 2. Bend your knees so they point  up to the ceiling. Your feet should be flat on the floor. 3. Tighten your lower belly (abdomen) muscles to press your lower back against the floor. This will make your tailbone point up to the ceiling instead of pointing down to your feet or the floor. 4. Stay in this position for 5-10 seconds while you gently tighten your muscles and breathe evenly. Cat-cow Do these steps until your lower back bends more easily: 1. Get on your hands and knees on a firm surface. Keep your hands under your shoulders, and keep your knees under your hips. You may put padding under your knees. 2. Let your head hang down toward your chest. Tighten (contract) the muscles in your belly. Point your tailbone toward the floor so your lower back becomes rounded like the back of a cat. 3. Stay in this position for 5 seconds. 4. Slowly lift your head. Let the muscles of your belly relax. Point your tailbone up toward the ceiling so your back forms a sagging arch like the back of a cow. 5. Stay in this position for 5 seconds.  Press-ups Do these steps 5-10 times in a row: 1. Lie on your belly (face-down) on the floor. 2. Place your hands near your head, about shoulder-width apart. 3. While you keep your back relaxed and keep your hips on the floor, slowly straighten your arms to raise the top half of your body and lift your shoulders. Do not use your back muscles. You may change where you place your hands in order to make yourself more comfortable. 4. Stay in this position for 5 seconds. 5. Slowly return to lying flat on the floor.  Bridges Do these steps 10 times in a row: 1. Lie on your back on a firm surface. 2. Bend your knees so they point up to the ceiling. Your feet should be flat on the floor. Your arms should be flat at your sides, next to your body. 3. Tighten your butt muscles and lift your butt off the floor until your waist is almost as high as your knees. If you do not feel the muscles working in your butt and  the back of your thighs, slide your feet 1-2 inches farther away from your butt. 4. Stay in this position for 3-5 seconds. 5. Slowly lower your butt to the floor, and let your butt muscles relax. If this exercise is too easy, try doing it with your arms crossed over your chest. Belly crunches Do these steps 5-10 times in a row: 1. Lie on your back on a firm bed or the floor with your legs stretched out. 2. Bend your knees so they point up to the ceiling. Your feet should be flat on the floor. 3. Cross your arms over your chest. 4. Tip your chin  a little bit toward your chest but do not bend your neck. 5. Tighten your belly muscles and slowly raise your chest just enough to lift your shoulder blades a tiny bit off of the floor. Avoid raising your body higher than that, because it can put too much stress on your low back. 6. Slowly lower your chest and your head to the floor. Back lifts Do these steps 5-10 times in a row: 1. Lie on your belly (face-down) with your arms at your sides, and rest your forehead on the floor. 2. Tighten the muscles in your legs and your butt. 3. Slowly lift your chest off of the floor while you keep your hips on the floor. Keep the back of your head in line with the curve in your back. Look at the floor while you do this. 4. Stay in this position for 3-5 seconds. 5. Slowly lower your chest and your face to the floor. Contact a doctor if:  Your back pain gets a lot worse when you do an exercise.  Your back pain does not get better 2 hours after you exercise. If you have any of these problems, stop doing the exercises. Do not do them again unless your doctor says it is okay. Get help right away if:  You have sudden, very bad back pain. If this happens, stop doing the exercises. Do not do them again unless your doctor says it is okay. This information is not intended to replace advice given to you by your health care provider. Make sure you discuss any questions you  have with your health care provider. Document Revised: 02/09/2018 Document Reviewed: 02/09/2018 Elsevier Patient Education  2020 [employer].

## 2020-06-03 NOTE — Progress Notes (Signed)
New Patient Office Visit  Subjective:  Patient ID: Marcus Gross, male    DOB: 01/21/1991  Age: 30 y.o. MRN: [id number]  CC:  Chief Complaint  Patient presents with  . New Patient (Initial Visit)    New patient feet have been swelling and hurt to stand on them right wrist keeps swelling makes bones hurt really bad also has pain in lower back     HPI Marcus Gross is a 30 year old male with past medical history of chronic right hand pain and back pain who presents for establishing care.  Patient complains of right-sided wrist and hand pain, which is chronic, intermittent, varying in intensity from 5-8/10 and is not associated with numbness or tingling currently.  Patient has had ER visit for hand pain.  X-ray of the right hand was insignificant in 08/2017.  He also complains of chronic low back pain and mentions that it is worse after waking up in the morning.  She denies any recent injury or weight lifting.  He denies any numbness, tingling or weakness in the LE.  Patient also mentions about right foot bunion, which gets inflamed at times and does not get better with ibuprofen alone.  He has difficulty wearing shoes when the bunion gets inflamed.  He has had 1 dose of J&J vaccine.  He denies flu vaccine despite explaining benefits.  Past Medical History:  Diagnosis Date  . Anxiety   . Burn   . Panic attack     Past Surgical History:  Procedure Laterality Date  . SKIN GRAFT    . SKIN GRAFT      History reviewed. No pertinent family history.  Social History   Socioeconomic History  . Marital status: Single    Spouse name: Not on file  . Number of children: Not on file  . Years of education: Not on file  . Highest education level: Not on file  Occupational History  . Not on file  Tobacco Use  . Smoking status: Current Some Day Smoker    Types: Cigarettes  . Smokeless tobacco: Never Used  Vaping Use  . Vaping Use: Never used  Substance and Sexual Activity  .  Alcohol use: Yes    Alcohol/week: 5.0 standard drinks    Types: 5 Cans of beer per week    Comment: occas  . Drug use: No  . Sexual activity: Not on file  Other Topics Concern  . Not on file  Social History Narrative  . Not on file   Social Determinants of Health   Financial Resource Strain: Not on file  Food Insecurity: Not on file  Transportation Needs: Not on file  Physical Activity: Not on file  Stress: Not on file  Social Connections: Not on file  Intimate Partner Violence: Not on file    ROS Review of Systems  Constitutional: Negative for chills and fever.  HENT: Negative for congestion and sore throat.   Eyes: Negative for pain and discharge.  Respiratory: Negative for cough and shortness of breath.   Cardiovascular: Negative for chest pain and palpitations.  Gastrointestinal: Negative for constipation, diarrhea, nausea and vomiting.  Endocrine: Negative for polydipsia and polyuria.  Genitourinary: Negative for dysuria and hematuria.  Musculoskeletal: Positive for arthralgias and back pain. Negative for neck pain and neck stiffness.  Skin: Negative for rash.  Neurological: Negative for dizziness, weakness, numbness and headaches.  Psychiatric/Behavioral: Negative for agitation and behavioral problems.    Objective:   Today's Vitals: BP 125/80 (  BP Location: Right Arm, Patient Position: Sitting, Cuff Size: Normal)   Pulse 85   Temp 99.2 F (37.3 C) (Oral)   Resp 18   Ht 5' 3"  (1.6 m)   Wt 135 lb 6.4 oz (61.4 kg)   SpO2 98%   BMI 23.99 kg/m   Physical Exam Vitals reviewed.  Constitutional:      General: He is not in acute distress.    Appearance: He is not diaphoretic.  HENT:     Head: Normocephalic and atraumatic.     Nose: Nose normal.     Mouth/Throat:     Mouth: Mucous membranes are moist.  Eyes:     General: No scleral icterus.    Extraocular Movements: Extraocular movements intact.     Pupils: Pupils are equal, round, and reactive to light.   Cardiovascular:     Rate and Rhythm: Normal rate and regular rhythm.     Pulses: Normal pulses.     Heart sounds: Normal heart sounds. No murmur heard.   Pulmonary:     Breath sounds: Normal breath sounds. No wheezing or rales.  Abdominal:     Palpations: Abdomen is soft.     Tenderness: There is no abdominal tenderness.  Musculoskeletal:     Right hand: No swelling, lacerations or bony tenderness. Normal sensation.     Cervical back: Neck supple. No tenderness.     Right lower leg: No edema.     Left lower leg: No edema.     Right foot: Bunion present.  Feet:     Right foot:     Skin integrity: No ulcer or skin breakdown.  Skin:    General: Skin is warm.     Findings: No rash.  Neurological:     General: No focal deficit present.     Mental Status: He is alert and oriented to person, place, and time.     Sensory: No sensory deficit.     Motor: No weakness.  Psychiatric:        Mood and Affect: Mood normal.        Behavior: Behavior normal.     Assessment & Plan:   Problem List Items Addressed This Visit      Encounter to establish care - Primary   Care established Previous chart reviewed History and medications reviewed with the patient     Relevant Orders  CBC with Differential  CMP14+EGFR  Lipid panel  Vitamin D (25 hydroxy)  TSH    Musculoskeletal and Integument   Bunion, right foot    Referred to Podiatry Advised to use wide bases shoes Heating pad or pain relieving creams Tramadol PRN for severe pain      Relevant Medications   traMADol (ULTRAM) 50 MG tablet   Other Relevant Orders   Ambulatory referral to Podiatry     Other      Right hand pain    Has had pain in right hand intermittently Occasional stiffness Check right hand X-ray Advised to take Tylenol/Ibuprofen PRN      Relevant Orders   DG Hand Complete Right   Chronic low back pain    Likely posture related Advised to avoid very soft bed Advised simple back  exercises Tylenol/Ibuprofen PRN      Relevant Medications   traMADol (ULTRAM) 50 MG tablet      Outpatient Encounter Medications as of 06/03/2020  Medication Sig  . traMADol (ULTRAM) 50 MG tablet Take 1 tablet (50 mg total) by mouth  2 (two) times daily as needed for up to 10 days.  . [DISCONTINUED] amoxicillin (AMOXIL) 500 MG capsule Take 2 capsules (1,000 mg total) by mouth 3 (three) times daily. (Patient not taking: Reported on 06/03/2020)  . [DISCONTINUED] azithromycin (ZITHROMAX) 250 MG tablet Take first 2 tablets together, then 1 every day until finished.  . [DISCONTINUED] dexamethasone (DECADRON) 4 MG tablet Take 1 tablet (4 mg total) by mouth 2 (two) times daily.  . [DISCONTINUED] HYDROcodone-acetaminophen (NORCO/VICODIN) 5-325 MG tablet Take 1 tablet by mouth every 4 (four) hours as needed.  . [DISCONTINUED] ibuprofen (ADVIL,MOTRIN) 800 MG tablet Take 1 tablet (800 mg total) by mouth 3 (three) times daily.  . [DISCONTINUED] methocarbamol (ROBAXIN) 500 MG tablet Take 1 tablet (500 mg total) by mouth 3 (three) times daily.  . [DISCONTINUED] naproxen (NAPROSYN) 500 MG tablet Take 1 tablet (500 mg total) by mouth 2 (two) times daily.  . [DISCONTINUED] predniSONE (STERAPRED UNI-PAK 21 TAB) 10 MG (21) TBPK tablet Take 6 tabs for 2 days, then 5 for 2 days, then 4 for 2 days, then 3 for 2 days, 2 for 2 days, then 1 for 2 days  . [DISCONTINUED] traMADol (ULTRAM) 50 MG tablet Take 1 tablet (50 mg total) by mouth every 6 (six) hours as needed.   No facility-administered encounter medications on file as of 06/03/2020.    Follow-up: Return in about 6 months (around 12/01/2020).   Lindell Spar, MD

## 2020-06-03 NOTE — Assessment & Plan Note (Signed)
Likely posture related Advised to avoid very soft bed Advised simple back exercises Tylenol/Ibuprofen PRN

## 2020-12-04 ENCOUNTER — Ambulatory Visit: Payer: Self-pay | Admitting: Internal Medicine

## 2020-12-19 ENCOUNTER — Ambulatory Visit: Payer: Self-pay | Admitting: Internal Medicine

## 2021-01-14 ENCOUNTER — Ambulatory Visit: Payer: Self-pay | Admitting: Internal Medicine
